# Patient Record
Sex: Female | Born: 1985 | Race: White | Hispanic: No | Marital: Single | Smoking: Never smoker
Health system: Southern US, Community
[De-identification: ages and names within clinical notes are randomized; demographics above are authoritative.]

## PROBLEM LIST (undated history)

## (undated) HISTORY — PX: CHOLECYSTECTOMY: SHX55

---

## 2011-04-20 ENCOUNTER — Ambulatory Visit: Payer: Self-pay | Admitting: Internal Medicine

## 2011-05-12 ENCOUNTER — Ambulatory Visit: Payer: Self-pay | Admitting: Internal Medicine

## 2015-12-19 ENCOUNTER — Emergency Department
Admission: EM | Admit: 2015-12-19 | Discharge: 2015-12-19 | Disposition: A | Discharge status: Home / Self Care | Payer: Managed Care, Other (non HMO) | Attending: Emergency Medicine | Admitting: Emergency Medicine

## 2015-12-19 ENCOUNTER — Ambulatory Visit (INDEPENDENT_AMBULATORY_CARE_PROVIDER_SITE_OTHER)
Admission: EM | Admit: 2015-12-19 | Discharge: 2015-12-19 | Disposition: A | Discharge status: Other Acute Inpatient Hospital | Payer: Managed Care, Other (non HMO) | Source: Home / Self Care | Attending: Family Medicine | Admitting: Family Medicine

## 2015-12-19 ENCOUNTER — Emergency Department: Payer: Managed Care, Other (non HMO)

## 2015-12-19 DIAGNOSIS — R42 Dizziness and giddiness: Secondary | ICD-10-CM

## 2015-12-19 DIAGNOSIS — E860 Dehydration: Secondary | ICD-10-CM

## 2015-12-19 DIAGNOSIS — O2341 Unspecified infection of urinary tract in pregnancy, first trimester: Secondary | ICD-10-CM | POA: Insufficient documentation

## 2015-12-19 DIAGNOSIS — R55 Syncope and collapse: Secondary | ICD-10-CM | POA: Diagnosis not present

## 2015-12-19 DIAGNOSIS — Z3A01 Less than 8 weeks gestation of pregnancy: Secondary | ICD-10-CM | POA: Insufficient documentation

## 2015-12-19 DIAGNOSIS — R110 Nausea: Secondary | ICD-10-CM

## 2015-12-19 DIAGNOSIS — O219 Vomiting of pregnancy, unspecified: Secondary | ICD-10-CM | POA: Diagnosis present

## 2015-12-19 DIAGNOSIS — N390 Urinary tract infection, site not specified: Secondary | ICD-10-CM

## 2015-12-19 DIAGNOSIS — O210 Mild hyperemesis gravidarum: Secondary | ICD-10-CM

## 2015-12-19 DIAGNOSIS — N939 Abnormal uterine and vaginal bleeding, unspecified: Secondary | ICD-10-CM

## 2015-12-19 LAB — CBC
HCT: 36.7 % (ref 35.0–47.0)
Hemoglobin: 12.4 g/dL (ref 12.0–16.0)
MCH: 29.1 pg (ref 26.0–34.0)
MCHC: 33.9 g/dL (ref 32.0–36.0)
MCV: 85.9 fL (ref 80.0–100.0)
PLATELETS: 280 10*3/uL (ref 150–440)
RBC: 4.27 MIL/uL (ref 3.80–5.20)
RDW: 12.8 % (ref 11.5–14.5)
WBC: 13.9 10*3/uL — ABNORMAL HIGH (ref 3.6–11.0)

## 2015-12-19 LAB — BASIC METABOLIC PANEL
Anion gap: 9 (ref 5–15)
BUN: 6 mg/dL (ref 6–20)
CALCIUM: 9.6 mg/dL (ref 8.9–10.3)
CHLORIDE: 102 mmol/L (ref 101–111)
CO2: 25 mmol/L (ref 22–32)
CREATININE: 0.53 mg/dL (ref 0.44–1.00)
GFR calc non Af Amer: >60 mL/min (ref >60)
GLUCOSE: 84 mg/dL (ref 65–99)
Potassium: 4 mmol/L (ref 3.5–5.1)
SODIUM: 136 mmol/L (ref 135–145)

## 2015-12-19 LAB — URINALYSIS COMPLETE WITH MICROSCOPIC (ARMC ONLY)
BILIRUBIN URINE: NEGATIVE
GLUCOSE, UA: NEGATIVE mg/dL
Hgb urine dipstick: NEGATIVE
KETONES UR: NEGATIVE mg/dL
Nitrite: NEGATIVE
PROTEIN: NEGATIVE mg/dL
Specific Gravity, Urine: 1.011 (ref 1.005–1.030)
pH: 8 (ref 5.0–8.0)

## 2015-12-19 LAB — GLUCOSE, CAPILLARY: Glucose-Capillary: 87 mg/dL (ref 65–99)

## 2015-12-19 LAB — HCG, QUANTITATIVE, PREGNANCY: hCG, Beta Chain, Quant, S: 88940 m[IU]/mL — ABNORMAL HIGH (ref <5)

## 2015-12-19 MED ORDER — ACETAMINOPHEN 500 MG PO TABS
1000.0000 mg | ORAL_TABLET | Freq: Once | ORAL | Status: AC
  Administered 2015-12-19: 1000 mg via ORAL

## 2015-12-19 MED ORDER — CEPHALEXIN 500 MG PO CAPS
500.0000 mg | ORAL_CAPSULE | Freq: Once | ORAL | Status: AC
  Administered 2015-12-19: 500 mg via ORAL
  Filled 2015-12-19 (×2): qty 1

## 2015-12-19 MED ORDER — CEPHALEXIN 250 MG PO CAPS: 250.0000 mg | ORAL_CAPSULE | Freq: Four times a day (QID) | ORAL | Status: AC

## 2015-12-19 MED ORDER — SODIUM CHLORIDE 0.9 % IV BOLUS (SEPSIS)
1000.0000 mL | Freq: Once | INTRAVENOUS | Status: AC
  Administered 2015-12-19: 1000 mL via INTRAVENOUS

## 2015-12-19 MED ORDER — ACETAMINOPHEN 500 MG PO TABS
ORAL_TABLET | ORAL | Status: AC
  Filled 2015-12-19: qty 2

## 2015-12-19 MED ORDER — ONDANSETRON HCL 4 MG/2ML IJ SOLN
INTRAMUSCULAR | Status: AC
  Administered 2015-12-19: 4 mg via INTRAVENOUS
  Filled 2015-12-19: qty 2

## 2015-12-19 MED ORDER — ONDANSETRON HCL 4 MG/2ML IJ SOLN
4.0000 mg | Freq: Once | INTRAMUSCULAR | Status: AC
  Administered 2015-12-19: 4 mg via INTRAVENOUS

## 2015-12-19 NOTE — Discharge Instructions (Signed)
Your HCG was 89,0000 today. Ultrasound showed a fetus about the correct size for your expected pregnancy date, however we were not able to see a heartbeat and you will need follow-up to make sure there is not a problem with this pregnancy. It is possible that your baby is growing normally and the heart just was not seen well because it is so small. Please call your doctor for follow-up on Tuesday or Wednesday of this week. We recommend you have a repeat "hCG" testing and an ultrasound to reevaluate the health of your pregnancy.  Urinary Tract Infection Urinary tract infections (UTIs) can develop anywhere along your urinary tract. Your urinary tract is your body's drainage system for removing wastes and extra water. Your urinary tract includes two kidneys, two ureters, a bladder, and a urethra. Your kidneys are a pair of bean-shaped organs. Each kidney is about the size of your fist. They are located below your ribs, one on each side of your spine. CAUSES Infections are caused by microbes, which are microscopic organisms, including fungi, viruses, and bacteria. These organisms are so small that they can only be seen through a microscope. Bacteria are the microbes that most commonly cause UTIs. SYMPTOMS  Symptoms of UTIs may vary by age and gender of the patient and by the location of the infection. Symptoms in young women typically include a frequent and intense urge to urinate and a painful, burning feeling in the bladder or urethra during urination. Older women and men are more likely to be tired, shaky, and weak and have muscle aches and abdominal pain. A fever may mean the infection is in your kidneys. Other symptoms of a kidney infection include pain in your back or sides below the ribs, nausea, and vomiting. DIAGNOSIS To diagnose a UTI, your caregiver will ask you about your symptoms. Your caregiver will also ask you to provide a urine sample. The urine sample will be tested for bacteria and white  blood cells. White blood cells are made by your body to help fight infection. TREATMENT  Typically, UTIs can be treated with medication. Because most UTIs are caused by a bacterial infection, they usually can be treated with the use of antibiotics. The choice of antibiotic and length of treatment depend on your symptoms and the type of bacteria causing your infection. HOME CARE INSTRUCTIONS  If you were prescribed antibiotics, take them exactly as your caregiver instructs you. Finish the medication even if you feel better after you have only taken some of the medication.  Drink enough water and fluids to keep your urine clear or pale yellow.  Avoid caffeine, tea, and carbonated beverages. They tend to irritate your bladder.  Empty your bladder often. Avoid holding urine for long periods of time.  Empty your bladder before and after sexual intercourse.  After a bowel movement, women should cleanse from front to back. Use each tissue only once. SEEK MEDICAL CARE IF:   You have back pain.  You develop a fever.  Your symptoms do not begin to resolve within 3 days. SEEK IMMEDIATE MEDICAL CARE IF:   You have severe back pain or lower abdominal pain.  You develop chills.  You have nausea or vomiting.  You have continued burning or discomfort with urination. MAKE SURE YOU:   Understand these instructions.  Will watch your condition.  Will get help right away if you are not doing well or get worse.   This information is not intended to replace advice given to you  by your health care provider. Make sure you discuss any questions you have with your health care provider.   Document Released: 04/19/2005 Document Revised: 03/31/2015 Document Reviewed: 08/18/2011 Elsevier Interactive Patient Education 2016 [employer]   Morning Sickness Morning sickness is when you feel sick to your stomach (nauseous) during pregnancy. This nauseous feeling may or may not come with vomiting. It  often occurs in the morning but can be a problem any time of day. Morning sickness is most common during the first trimester, but it may continue throughout pregnancy. While morning sickness is unpleasant, it is usually harmless unless you develop severe and continual vomiting (hyperemesis gravidarum). This condition requires more intense treatment.  CAUSES  The cause of morning sickness is not completely known but seems to be related to normal hormonal changes that occur in pregnancy. RISK FACTORS You are at greater risk if you:  Experienced nausea or vomiting before your pregnancy.  Had morning sickness during a previous pregnancy.  Are pregnant with more than one baby, such as twins. TREATMENT  Do not use any medicines (prescription, over-the-counter, or herbal) for morning sickness without first talking to your health care provider. Your health care provider may prescribe or recommend:  Vitamin B6 supplements.  Anti-nausea medicines.  The herbal medicine ginger. HOME CARE INSTRUCTIONS   Only take over-the-counter or prescription medicines as directed by your health care provider.  Taking multivitamins before getting pregnant can prevent or decrease the severity of morning sickness in most women.  Eat a piece of dry toast or unsalted crackers before getting out of bed in the morning.  Eat five or six small meals a day.  Eat dry and bland foods (rice, baked potato). Foods high in carbohydrates are often helpful.  Do not drink liquids with your meals. Drink liquids between meals.  Avoid greasy, fatty, and spicy foods.  Get someone to cook for you if the smell of any food causes nausea and vomiting.  If you feel nauseous after taking prenatal vitamins, take the vitamins at night or with a snack.  Snack on protein foods (nuts, yogurt, cheese) between meals if you are hungry.  Eat unsweetened gelatins for desserts.  Wearing an acupressure wristband (worn for sea sickness)  may be helpful.  Acupuncture may be helpful.  Do not smoke.  Get a humidifier to keep the air in your house free of odors.  Get plenty of fresh air. SEEK MEDICAL CARE IF:   Your home remedies are not working, and you need medicine.  You feel dizzy or lightheaded.  You are losing weight. SEEK IMMEDIATE MEDICAL CARE IF:   You have persistent and uncontrolled nausea and vomiting.  You pass out (faint). MAKE SURE YOU:  Understand these instructions.  Will watch your condition.  Will get help right away if you are not doing well or get worse.   This information is not intended to replace advice given to you by your health care provider. Make sure you discuss any questions you have with your health care provider.   Document Released: 08/31/2006 Document Revised: 07/15/2013 Document Reviewed: 12/25/2012 Elsevier Interactive Patient Education [employer]

## 2015-12-19 NOTE — ED Provider Notes (Signed)
[facility] Emergency Department Provider Note  ____________________________________________  Time seen: Approximately 3:57 PM  I have reviewed the triage vital signs and the nursing notes.   HISTORY  Chief Complaint Near Syncope    HPI Jessica Horne is a 30 y.o. female presents for evaluation of an episode of passing out in the shower. Patient reports that she passed out today in the shower, she got very lightheaded before and laid down and thinks she may have passed out for a second. She did not fall or strike her head or injure herself. She was awake immediately on the floor.  She is about [redacted] weeks pregnant based on a previous last menstrual period. She has not had any abdominal pain and vaginal discharge or bleeding. She had no shortness of breath or chest pain.  She reports that she is taking Zofran because she's been having issues with morning sickness, she's been having occasional vomiting and dehydration, and the last few days she has been experiencing fairly severe symptoms. She has been able to eat and drink some, however reports that she still has ongoing nausea and vomiting, but at the present time this has resolved.  She's been feeling lightheaded especially with standing, and weakness with walking and standing for the last several days.   No past medical history on file.  Psoriatic arthritis  There are no active problems to display for this patient.   Past Surgical History  Procedure Laterality Date  . Cholecystectomy      Current Outpatient Rx  Name  Route  Sig  Dispense  Refill  . cephALEXin (KEFLEX) 250 MG capsule   Oral   Take 1 capsule (250 mg total) by mouth 4 (four) times daily.   40 capsule   0   . ondansetron (ZOFRAN) 4 MG tablet   Oral   Take 4 mg by mouth every 8 (eight) hours as needed for nausea or vomiting.           Allergies Sulfa antibiotics  No family history on file.  Social History Social History   Substance Use Topics  . Smoking status: Never Smoker   . Smokeless tobacco: Not on file  . Alcohol Use: No    Review of Systems Constitutional: No fever/chills Eyes: No visual changes. ENT: No sore throat. Cardiovascular: Denies chest pain. Respiratory: Denies shortness of breath. Gastrointestinal: No abdominal pain.  No nausea, no vomiting.  No diarrhea.  No constipation. Genitourinary: Negative for dysuria.Increased urinary frequency. Musculoskeletal: Negative for back pain. Skin: Negative for rash. Neurological: Negative for headaches, focal weakness or numbness.  10-point ROS otherwise negative.  ____________________________________________   PHYSICAL EXAM:  VITAL SIGNS: ED Triage Vitals  Enc Vitals Group     BP 12/19/15 1352 105/57 mmHg     Pulse Rate 12/19/15 1352 57     Resp 12/19/15 1352 20     Temp 12/19/15 1352 98.4 F (36.9 C)     Temp Source 12/19/15 1352 Oral     SpO2 12/19/15 1352 100 %     Weight 12/19/15 1352 165 lb (74.844 kg)     Height 12/19/15 1352 5' (1.524 m)     Head Cir --      Peak Flow --      Pain Score --      Pain Loc --      Pain Edu? --      Excl. in GC? --    Constitutional: Alert and oriented. Well appearing and  in no acute distress.Very pleasant and escorted by her husband. Eyes: Conjunctivae are normal. PERRL. EOMI. Head: Atraumatic. Nose: No congestion/rhinnorhea. Mouth/Throat: Mucous membranes are moist.  Oropharynx non-erythematous. Neck: No stridor.   Cardiovascular: Normal rate, regular rhythm. Grossly normal heart sounds.  Good peripheral circulation. Respiratory: Normal respiratory effort.  No retractions. Lungs CTAB. Gastrointestinal: Soft and nontender. No distention. No uterus palpable. No abdominal bruits. No CVA tenderness. Musculoskeletal: No lower extremity tenderness nor edema.  No joint effusions. Neurologic:  Normal speech and language. No gross focal neurologic deficits are appreciated. No gait  instability. Skin:  Skin is warm, dry and intact. No rash noted. Psychiatric: Mood and affect are normal. Speech and behavior are normal.  ____________________________________________   LABS (all labs ordered are listed, but only abnormal results are displayed)  Labs Reviewed  CBC - Abnormal; Notable for the following:    WBC 13.9 (*)    All other components within normal limits  URINALYSIS COMPLETEWITH MICROSCOPIC ([facility] ONLY) - Abnormal; Notable for the following:    Color, Urine YELLOW (*)    APPearance HAZY (*)    Leukocytes, UA 3+ (*)    Bacteria, UA RARE (*)    Squamous Epithelial / LPF 0-5 (*)    All other components within normal limits  HCG, QUANTITATIVE, PREGNANCY - Abnormal; Notable for the following:    hCG, Beta Chain, Quant, S 16109 (*)    All other components within normal limits  URINE CULTURE  BASIC METABOLIC PANEL  CBG MONITORING, ED   ____________________________________________  EKG  Reviewed and are me at 1610 Ventricular rate 60 QRS 80 QTc 420 Reviewed and interpreted as normal sinus rhythm, there is a slight hint of an incomplete right bundle branch block with associated T-wave inversions likely secondary to a mild incomplete block. There is no evidence of acute ischemia. It is notable patient is not complaining of any cardiac or pulmonary symptoms. ____________________________________________  RADIOLOGY  US Ob Transvaginal (Final result) Result time: 12/19/15 18:15:17   Final result by Rad Results In Interface (12/19/15 18:15:17)   Narrative:   CLINICAL DATA: Pregnancy. Vaginal spotting for 1 day.  EXAM: OBSTETRIC <14 WK [place] AND TRANSVAGINAL OB US  TECHNIQUE: Both transabdominal and transvaginal ultrasound examinations were performed for complete evaluation of the gestation as well as the maternal uterus, adnexal regions, and pelvic cul-de-sac. Transvaginal technique was performed to assess early pregnancy.  COMPARISON:  None.  FINDINGS: Intrauterine gestational sac: Single  Yolk sac: Present  Embryo: Present  Cardiac Activity: Not present  CRL: 6.7 mm  6 w  4 d         [place] EDC: 08/09/2016  Subchorionic hemorrhage: Subchorionic hemorrhage measuring 2.6 x 1.3 x 1.1 cm.  Maternal uterus/adnexae: Normal left ovary. 2.2 x 2.1 x 1.2 cm hypoechoic right ovarian mass with peripheral Doppler flow likely representing a corpus luteum cyst. Small amount of pelvic free fluid.  IMPRESSION: 1. Intrauterine pregnancy. No cardiac activity identified at this time. Findings are suspicious but not yet definitive for failed pregnancy. Recommend follow-up US in 10-14 days for definitive diagnosis. This recommendation follows SRU consensus guidelines: Diagnostic Criteria for Nonviable Pregnancy Early in the First Trimester. Malva Limes Med 2013; 604:5409-81.   Electronically Signed By: Elige Ko On: 12/19/2015 18:15          US OB Comp Less 14 Wks (Final result) Result time: 12/19/15 18:15:17   Procedure changed from US OB Limited      Final result by Rad Results  In Interface (12/19/15 18:15:17)   Narrative:   CLINICAL DATA: Pregnancy. Vaginal spotting for 1 day.  EXAM: OBSTETRIC <14 WK US AND TRANSVAGINAL OB US  TECHNIQUE: Both transabdominal and transvaginal ultrasound examinations were performed for complete evaluation of the gestation as well as the maternal uterus, adnexal regions, and pelvic cul-de-sac. Transvaginal technique was performed to assess early pregnancy.  COMPARISON: None.  FINDINGS: Intrauterine gestational sac: Single  Yolk sac: Present  Embryo: Present  Cardiac Activity: Not present  CRL: 6.7 mm  6 w  4 d         [place] EDC: 08/09/2016  Subchorionic hemorrhage: Subchorionic hemorrhage measuring 2.6 x 1.3 x 1.1 cm.  Maternal uterus/adnexae: Normal left ovary. 2.2 x 2.1 x 1.2 cm hypoechoic right ovarian mass with peripheral  Doppler flow likely representing a corpus luteum cyst. Small amount of pelvic free fluid.  IMPRESSION: 1. Intrauterine pregnancy. No cardiac activity identified at this time. Findings are suspicious but not yet definitive for failed pregnancy. Recommend follow-up US in 10-14 days for definitive diagnosis. This recommendation follows SRU consensus guidelines: Diagnostic Criteria for Nonviable Pregnancy Early in the First Trimester. Malva Limes Engl J Med 2013; 161:0960-45; 369:1443-51.   Electronically Signed By: Elige KoHetal Patel On: 12/19/2015 18:15    ____________________________________________   PROCEDURES  Procedure(s) performed: None  Critical Care performed: No  ____________________________________________   INITIAL IMPRESSION / ASSESSMENT AND PLAN / ED COURSE  Pertinent labs & imaging results that were available during my care of the patient were reviewed by me and considered in my medical decision making (see chart for details).  Patient presents with a syncopal episode. She presently reports feeling well, but feels she may be dehydrated due to morning sickness. There symptoms of passing out in the shower without any associated cardiac or pulmonary symptoms or acute neurologic symptoms sound like they're likely due to dehydration and possible vasovagal type syncope. She does report pregnancy but has not had intrauterine pregnancy confirmation, and based on laboratory testing suspect likely a mild urinary tract infection.  Overall very reassuring exam with stable hemodynamics and slight low blood pressure in the ER which may be secondary to pregnancy or slightly due to dehydration.  Normal and reassuring abdominal cardiac neurologic and respiratory exam. We will hydrate her well, obtain ultrasound to evaluate for intrauterine pregnancy, and treat for possible urinary tract infection.  ----------------------------------------- 6:59 PM on  12/19/2015 -----------------------------------------  Ultrasound discussed carefully with the patient, she will follow up for further evaluation and testing with her OB/GYN doctor. She is well hydrated, awake alert in no distress. Discussed careful return precautions, hydration, and follow up with OB/GYN. ____________________________________________   FINAL CLINICAL IMPRESSION(S) / ED DIAGNOSES  Final diagnoses:  Morning sickness  Nausea  Dehydration  Syncope, vasovagal  Acute UTI (urinary tract infection)      Sharyn CreamerMark Quale, MD 12/19/15 1859

## 2015-12-19 NOTE — Discharge Instructions (Signed)
Go directly to emergency room as discussed.   Dizziness Dizziness is a common problem. It is a feeling of unsteadiness or light-headedness. You may feel like you are about to faint. Dizziness can lead to injury if you stumble or fall. Anyone can become dizzy, but dizziness is more common in older adults. This condition can be caused by a number of things, including medicines, dehydration, or illness. HOME CARE INSTRUCTIONS Taking these steps may help with your condition: Eating and Drinking  Drink enough fluid to keep your urine clear or pale yellow. This helps to keep you from becoming dehydrated. Try to drink more clear fluids, such as water.  Do not drink alcohol.  Limit your caffeine intake if directed by your health care provider.  Limit your salt intake if directed by your health care provider. Activity  Avoid making quick movements.  Rise slowly from chairs and steady yourself until you feel okay.  In the morning, first sit up on the side of the bed. When you feel okay, stand slowly while you hold onto something until you know that your balance is fine.  Move your legs often if you need to stand in one place for a long time. Tighten and relax your muscles in your legs while you are standing.  Do not drive or operate heavy machinery if you feel dizzy.  Avoid bending down if you feel dizzy. Place items in your home so that they are easy for you to reach without leaning over. Lifestyle  Do not use any tobacco products, including cigarettes, chewing tobacco, or electronic cigarettes. If you need help quitting, ask your health care provider.  Try to reduce your stress level, such as with yoga or meditation. Talk with your health care provider if you need help. General Instructions  Watch your dizziness for any changes.  Take medicines only as directed by your health care provider. Talk with your health care provider if you think that your dizziness is caused by a medicine that  you are taking.  Tell a friend or a family member that you are feeling dizzy. If he or she notices any changes in your behavior, have this person call your health care provider.  Keep all follow-up visits as directed by your health care provider. This is important. SEEK MEDICAL CARE IF:  Your dizziness does not go away.  Your dizziness or light-headedness gets worse.  You feel nauseous.  You have reduced hearing.  You have new symptoms.  You are unsteady on your feet or you feel like the room is spinning. SEEK IMMEDIATE MEDICAL CARE IF:  You vomit or have diarrhea and are unable to eat or drink anything.  You have problems talking, walking, swallowing, or using your arms, hands, or legs.  You feel generally weak.  You are not thinking clearly or you have trouble forming sentences. It may take a friend or family member to notice this.  You have chest pain, abdominal pain, shortness of breath, or sweating.  Your vision changes.  You notice any bleeding.  You have a headache.  You have neck pain or a stiff neck.  You have a fever.   This information is not intended to replace advice given to you by your health care provider. Make sure you discuss any questions you have with your health care provider.   Document Released: 01/03/2001 Document Revised: 11/24/2014 Document Reviewed: 07/06/2014 Elsevier Interactive Patient Education 2016 [employer]  Near-Syncope Near-syncope (commonly known as near fainting) is sudden  weakness, dizziness, or feeling like you might pass out. During an episode of near-syncope, you may also develop pale skin, have tunnel vision, or feel sick to your stomach (nauseous). Near-syncope may occur when getting up after sitting or while standing for a long time. It is caused by a sudden decrease in blood flow to the brain. This decrease can result from various causes or triggers, most of which are not serious. However, because near-syncope can  sometimes be a sign of something serious, a medical evaluation is required. The specific cause is often not determined. HOME CARE INSTRUCTIONS  Monitor your condition for any changes. The following actions may help to alleviate any discomfort you are experiencing:  Have someone stay with you until you feel stable.  Lie down right away and prop your feet up if you start feeling like you might faint. Breathe deeply and steadily. Wait until all the symptoms have passed. Most of these episodes last only a few minutes. You may feel tired for several hours.   Drink enough fluids to keep your urine clear or pale yellow.   If you are taking blood pressure or heart medicine, get up slowly when seated or lying down. Take several minutes to sit and then stand. This can reduce dizziness.  Follow up with your health care provider as directed. SEEK IMMEDIATE MEDICAL CARE IF:   You have a severe headache.   You have unusual pain in the chest, abdomen, or back.   You are bleeding from the mouth or rectum, or you have black or tarry stool.   You have an irregular or very fast heartbeat.   You have repeated fainting or have seizure-like jerking during an episode.   You faint when sitting or lying down.   You have confusion.   You have difficulty walking.   You have severe weakness.   You have vision problems.  MAKE SURE YOU:   Understand these instructions.  Will watch your condition.  Will get help right away if you are not doing well or get worse.   This information is not intended to replace advice given to you by your health care provider. Make sure you discuss any questions you have with your health care provider.   Document Released: 07/10/2005 Document Revised: 07/15/2013 Document Reviewed: 12/13/2012 Elsevier Interactive Patient Education [employer]

## 2015-12-19 NOTE — ED Notes (Addendum)
Pt went to urgent care today for dizziness and vomiting. Pt states she is [redacted] weeks pregnant. Pt reports vomiting 2-3 times a day mostly at night no vomiting today. Pt also reports diarrhea. 2 episodes of diarrhea this morning denies fever. Denies pain  Pt states this morning she had an episode where she felt weak and almost fell in the shower

## 2015-12-19 NOTE — ED Notes (Signed)
Patient complains of dizziness, vomiting, off balance. Patient states that she blacked out in the shower this morning. Patient denies bleeding or abdominal pain. Patient states that she is [redacted] weeks pregnant and called OB last week and was given Rx for Zofran. She states that this feels different than how she has been recently with morning sickness.

## 2015-12-19 NOTE — ED Provider Notes (Signed)
[facility]  ____________________________________________  Time seen: Approximately 1:16 PM  I have reviewed the triage vital signs and the nursing notes.   HISTORY  Chief Complaint Dizziness  HPI Jessica Horne is a 30 y.o. female presents with husband at bedside for the complaints of dizziness. Patient reports that she is approximately [redacted] weeks pregnant based on her last period and tracking at home. Reports this pregnancy has not been confirmed by OB/GYN by ultrasound. Patient reports that she has had nausea and vomiting intermittently so far this pregnancy but states this has been worse. Patient reports that since Tuesday of this week she has had increased nausea and vomiting with intermittent dizzy episodes. Patient states the dizzy episodes have gradually increased over the week. Patient reports that today at approximately 11 AM she was in the shower, felt overheated and her body felt "heavy ". Patient states that she then "passed out". Patient states that she thinks that she did lose consciousness, but not sure, and states that it would've been for only 1 second or 2 seconds. States this was unwitnessed. Patient states that she did fall but states that she landed in a sitting position. Patient denies head injury. Patient denies any previous history of syncope. Patient reports that since the episode today she has continued dizziness and lightheadedness. Patient states that she does feel intermittently off balance. Patient reports that the nausea and vomiting have also continued. Patient reports the nausea and vomiting are uncontrolled by Zofran.   Patient reports gravida 2 para 1. Last menstrual 10/30/2015. Patient reports last pregnancy was uncomplicated until approximately 38 weeks when she had "an infection in the baby's fluid "and was induced but denies any other palpitations. Patient reports that overall she is in good health. Denies any recent sickness or recent infections. Denies  chest pain, shortness of breath, abdominal pain, vaginal bleeding, vaginal discharge, neck pain, back pain, extremity pain or injury. Patient reports that she did have a slight vaginal spotting one episode last week but denies any other similar. Patient does also reports that 2 weeks ago she abruptly stopped taking her Celexa in which she had been taking for several years.  PCP: [facility]: [facility]    History reviewed. No pertinent past medical history.  There are no active problems to display for this patient.   Past Surgical History  Procedure Laterality Date  . Cholecystectomy      Current Outpatient Rx  Name  Route  Sig  Dispense  Refill  . ondansetron (ZOFRAN) 4 MG tablet   Oral   Take 4 mg by mouth every 8 (eight) hours as needed for nausea or vomiting.           Allergies Sulfa antibiotics  No family history on file.  Social History Social History  Substance Use Topics  . Smoking status: Never Smoker   . Smokeless tobacco: None  . Alcohol Use: No    Review of Systems Constitutional: No fever/chills Eyes: No visual changes. ENT: No sore throat. Cardiovascular: Denies chest pain. Respiratory: Denies shortness of breath. Gastrointestinal: No abdominal pain.  No nausea, no vomiting.  No diarrhea.  No constipation. Genitourinary: Negative for dysuria. Musculoskeletal: Negative for back pain. Skin: Negative for rash. Neurological: Negative for headaches, focal weakness or numbness.  10-point ROS otherwise negative.  ____________________________________________   PHYSICAL EXAM:  VITAL SIGNS: ED Triage Vitals  Enc Vitals Group     BP 12/19/15 1226 123/68 mmHg     Pulse Rate 12/19/15  1226 66     Resp 12/19/15 1226 17     Temp 12/19/15 1226 98.3 F (36.8 C)     Temp Source 12/19/15 1226 Oral     SpO2 12/19/15 1226 100 %     Weight 12/19/15 1226 165 lb (74.844 kg)     Height 12/19/15 1226 5' (1.524 m)     Head Cir --      Peak Flow --       Pain Score 12/19/15 1228 0     Pain Loc --      Pain Edu? --      Excl. in GC? --    Orthostatic VS for the past 24 hrs:  BP- Lying Pulse- Lying BP- Sitting Pulse- Sitting BP- Standing at 0 minutes Pulse- Standing at 0 minutes  12/19/15 1229 106/57 mmHg 62 107/73 mmHg 62 111/67 mmHg 63    Constitutional: Alert and oriented. Well appearing and in no acute distress. Eyes: Conjunctivae are normal. PERRL. EOMI. Head: Atraumatic.Nontender.  Ears: no erythema, normal TMs bilaterally.   Nose: No congestion/rhinnorhea.  Mouth/Throat: Mucous membranes are moist.  Oropharynx non-erythematous. No dental injury noted. Neck: No stridor.  No cervical spine tenderness to palpation. Hematological/Lymphatic/Immunilogical: No cervical lymphadenopathy. Cardiovascular: Normal rate, regular rhythm. Grossly normal heart sounds.  Good peripheral circulation. Respiratory: Normal respiratory effort.  No retractions. Lungs CTAB. No wheezes, rales or rhonchi. Gastrointestinal: Soft and nontender. No CVA tenderness. Musculoskeletal: No lower or upper extremity tenderness nor edema. No cervical, thoracic or lumbar tenderness to palpation. Bilateral pedal pulses equal and easily palpated.  Neurologic:  Normal speech and language. No gross focal neurologic deficits are appreciated. No gait instability.5/5 strength to bilateral upper and lower extremities. Negative Romberg. Sensation intact to bilateral upper and lower extremities.  Skin:  Skin is warm, dry and intact. No rash noted. Psychiatric: Mood and affect are normal. Speech and behavior are normal.  ____________________________________________   LABS (all labs ordered are listed, but only abnormal results are displayed)  Labs Reviewed  GLUCOSE, CAPILLARY  CBG MONITORING, ED    RADIOLOGY  No results found. ____________________________________________   INITIAL IMPRESSION / ASSESSMENT AND PLAN / ED COURSE  Pertinent labs & imaging results that were  available during my care of the patient were reviewed by me and considered in my medical decision making (see chart for details).   Overall well-appearing patient. Presents with husband at bedside for the complaints of dizziness, nausea and vomiting with episode of possible syncope today. Patient is approximate [redacted] weeks pregnant based on last menstrual and per patient's calculations. Patient reports that she was tracking her menstrual was very closely as they were trying to conceive. As patient has continued dizziness, nausea and vomiting, possible syncope and is unconfirmed [redacted] week pregnant, discussed in detail with patient and spouse recommended for further evaluation emergency room of their choice. Discussed in detail with patient will likely need laboratory studies as well as ultrasound. The ultrasound availability at this facility at this time. Patient alert and oriented with decisional capacity and states that her husband will drive her. Patient and husband reports they will go directly to [facility] emergency room. [facility] charge nurse called and report given. Recommend patient get a directly to the emergency room. Patient stable at the time of discharge and transfer.   Discussed follow up with Primary care physician this week. Discussed follow up and return parameters including no resolution or any worsening concerns. Patient verbalized understanding and agreed to plan.  ____________________________________________   FINAL CLINICAL IMPRESSION(S) / ED DIAGNOSES  Final diagnoses:  Near syncope  Dizziness     New Prescriptions   No medications on file    Note: This dictation was prepared with Dragon dictation along with smaller phrase technology. Any transcriptional errors that result from this process are unintentional.      Renford DillsLindsey Zohar Laing, NP 12/19/15 1425

## 2015-12-21 LAB — URINE CULTURE: Special Requests: NORMAL

## 2016-06-06 IMAGING — US US OB COMP LESS 14 WK
1 series · 13 of 28 positions shown · non-contrast
Comparison: None.

CLINICAL DATA: Pregnancy.  Vaginal spotting for 1 day.

EXAM:
OBSTETRIC <14 WK US AND TRANSVAGINAL OB US
TECHNIQUE: Both transabdominal and transvaginal ultrasound examinations were
performed for complete evaluation of the gestation as well as the
maternal uterus, adnexal regions, and pelvic cul-de-sac.
Transvaginal technique was performed to assess early pregnancy.

[Series 1: us ob comp less 14 wk · 0.17mm/px · 13 of 100 slices shown]
[im 4/100]
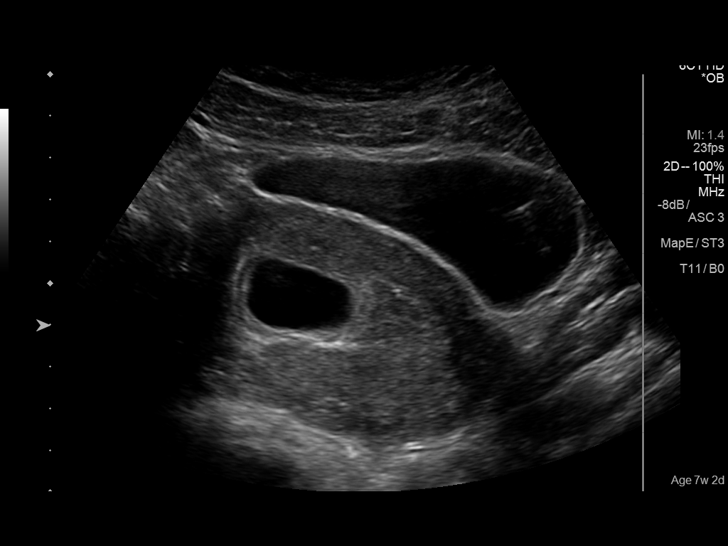
[im 12/100]
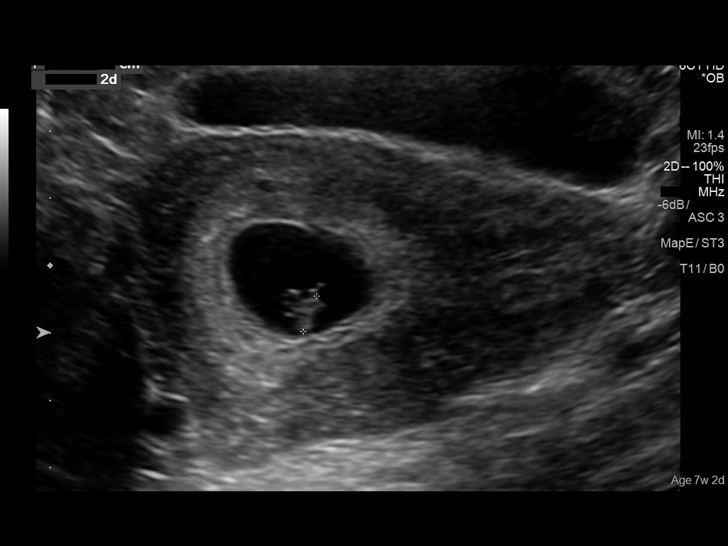
[im 19/100]
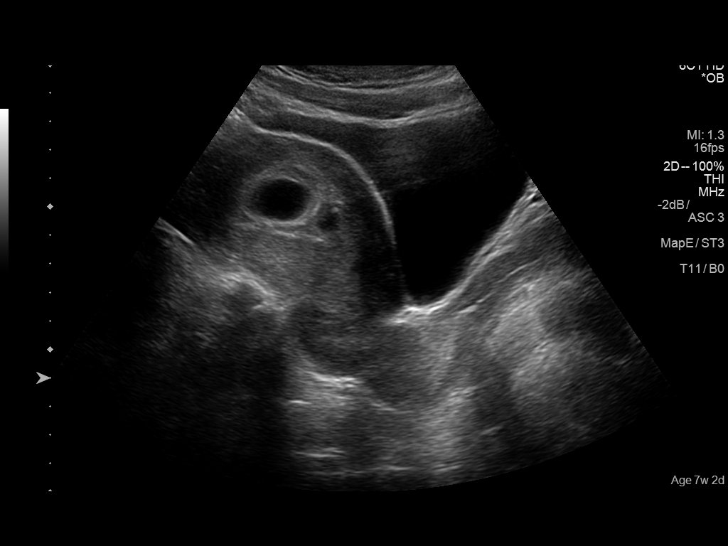
[im 26/100]
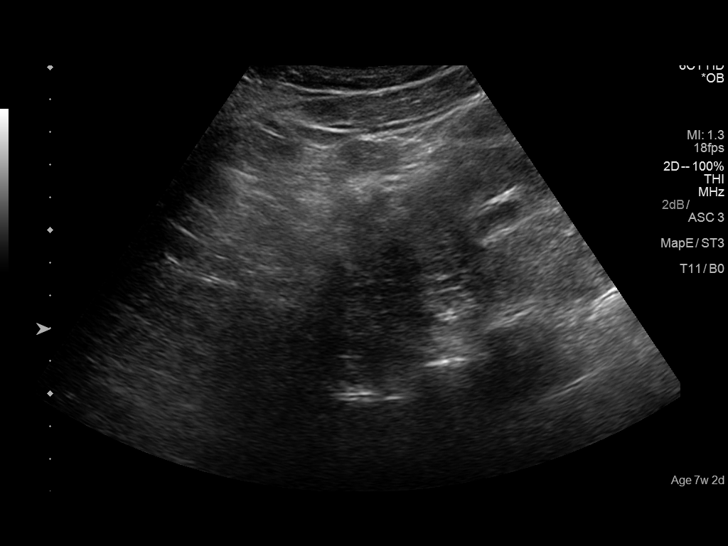
[im 34/100]
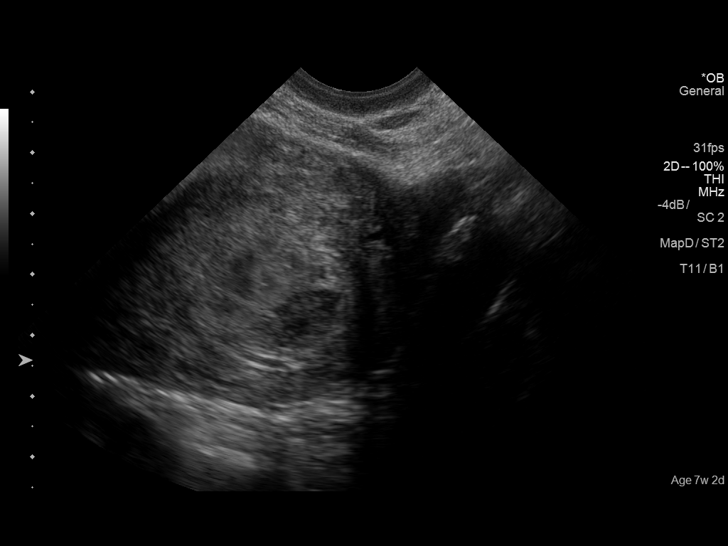
[im 41/100]
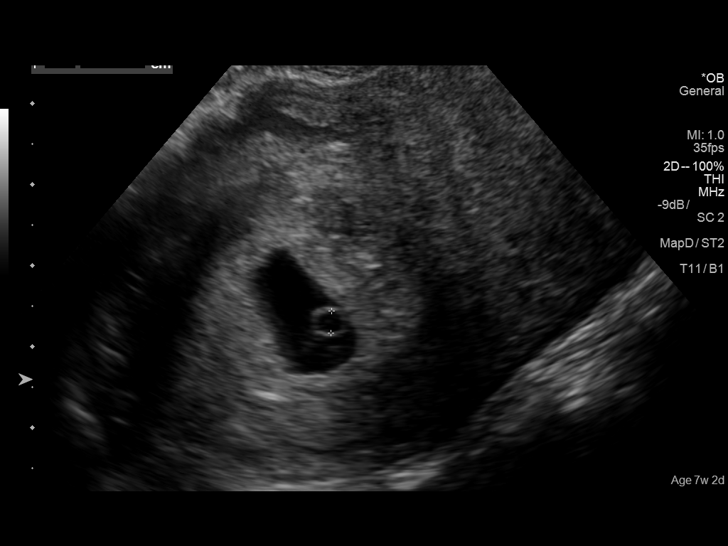
[im 52/100]
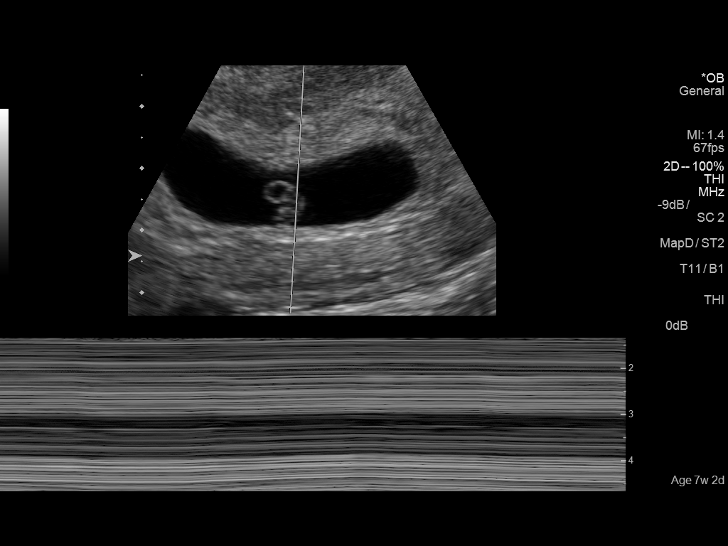
[im 59/100]
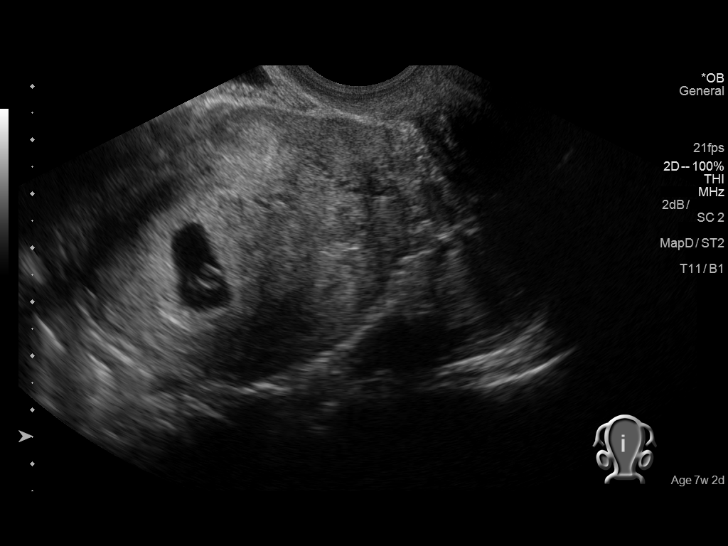
[im 67/100]
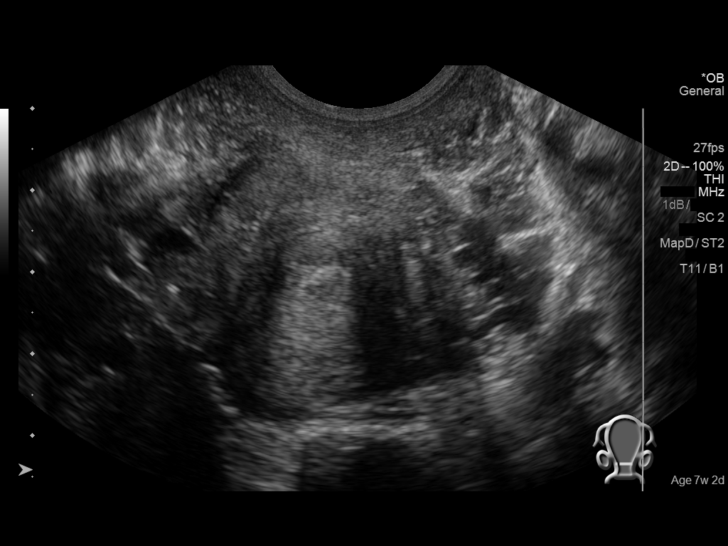
[im 74/100]
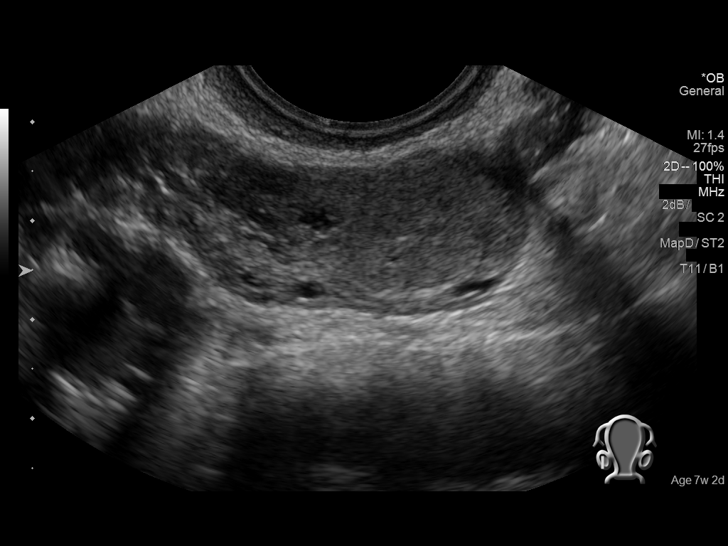
[im 81/100]
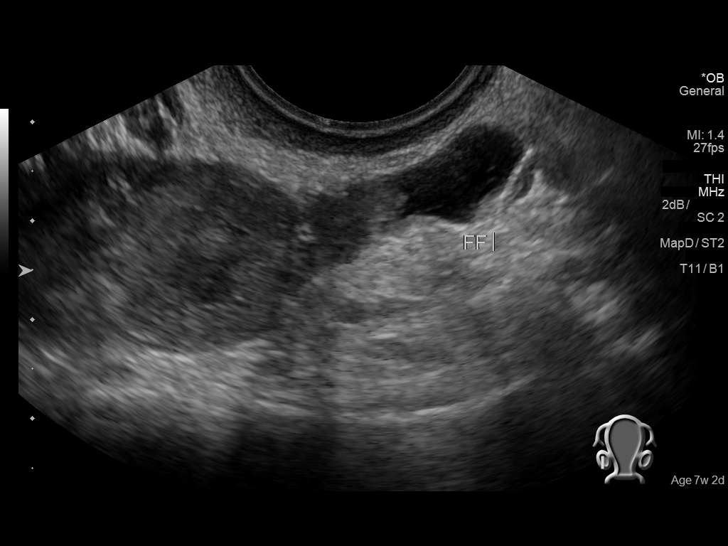
[im 89/100]
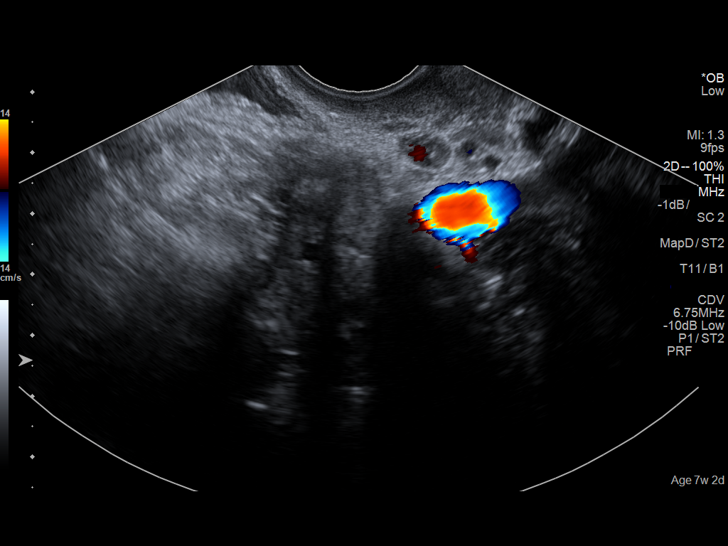
[im 96/100]
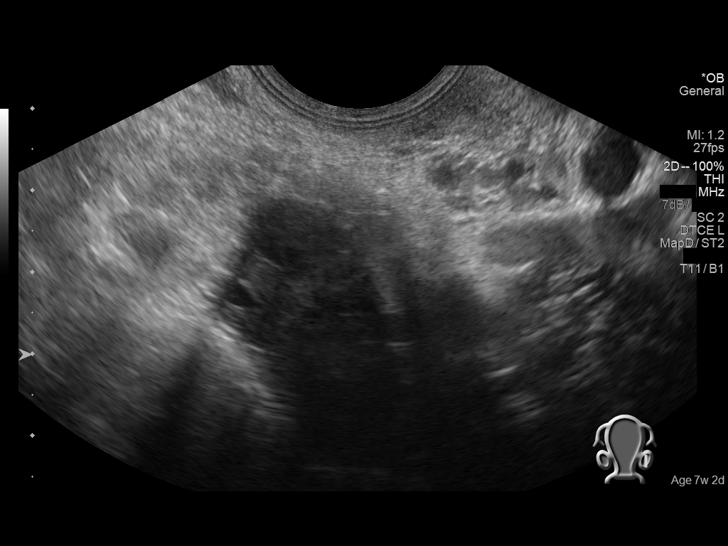

[13 of 28 positions shown; findings below may reference images not displayed]

FINDINGS: Intrauterine gestational sac: Single

Yolk sac:  Present

Embryo:  Present

Cardiac Activity: Not present

CRL:  6.7  mm   6 w   4 d                  US EDC: 08/09/2016

Subchorionic hemorrhage: Subchorionic hemorrhage measuring 2.6 x
x 1.1 cm.

Maternal uterus/adnexae: Normal left ovary. 2.2 x 2.1 x 1.2 cm
hypoechoic right ovarian mass with peripheral Doppler flow likely
representing a corpus luteum cyst. Small amount of pelvic free
fluid.
IMPRESSION: 1. Intrauterine pregnancy. No cardiac activity identified at this
time. Findings are suspicious but not yet definitive for failed
pregnancy. Recommend follow-up US in 10-14 days for definitive
diagnosis. This recommendation follows SRU consensus guidelines:
Diagnostic Criteria for Nonviable Pregnancy Early in the First
Trimester. N Engl J Med 8810; [DATE].

## 2016-07-26 ENCOUNTER — Emergency Department
Admission: EM | Admit: 2016-07-26 | Discharge: 2016-07-26 | Disposition: A | Discharge status: Home / Self Care | Payer: Managed Care, Other (non HMO) | Attending: Student in an Organized Health Care Education/Training Program | Admitting: Student in an Organized Health Care Education/Training Program

## 2016-07-26 ENCOUNTER — Emergency Department: Payer: Managed Care, Other (non HMO)

## 2016-07-26 ENCOUNTER — Encounter: Payer: Self-pay | Admitting: Emergency Medicine

## 2016-07-26 DIAGNOSIS — N9489 Other specified conditions associated with female genital organs and menstrual cycle: Secondary | ICD-10-CM | POA: Insufficient documentation

## 2016-07-26 DIAGNOSIS — O209 Hemorrhage in early pregnancy, unspecified: Secondary | ICD-10-CM | POA: Diagnosis present

## 2016-07-26 DIAGNOSIS — Z3A08 8 weeks gestation of pregnancy: Secondary | ICD-10-CM | POA: Insufficient documentation

## 2016-07-26 DIAGNOSIS — O039 Complete or unspecified spontaneous abortion without complication: Secondary | ICD-10-CM | POA: Diagnosis not present

## 2016-07-26 DIAGNOSIS — N939 Abnormal uterine and vaginal bleeding, unspecified: Secondary | ICD-10-CM

## 2016-07-26 LAB — HCG, QUANTITATIVE, PREGNANCY: hCG, Beta Chain, Quant, S: 17725 m[IU]/mL — ABNORMAL HIGH (ref <5)

## 2016-07-26 LAB — ABO/RH: ABO/RH(D): O POS

## 2016-07-26 MED ORDER — PROMETHAZINE HCL 12.5 MG PO TABS: 12.5000 mg | ORAL_TABLET | Freq: Four times a day (QID) | ORAL | 0 refills | Status: AC | PRN

## 2016-07-26 NOTE — ED Notes (Signed)

## 2016-07-26 NOTE — ED Provider Notes (Signed)
[facility] Emergency Department Provider Note    First MD Initiated Contact with Patient 07/26/16 2039     (approximate)  I have reviewed the triage vital signs and the nursing notes.   HISTORY  Chief Complaint Vaginal Bleeding    HPI Jessica Horne is a 31 y.o. female  G3P1A1 who is roughly [redacted] weeks pregnant that presents with 1 week of vaginal bleeding. States that she initially noted spotting on Monday only when she wiped. I did not notice any sort of tissue or clots. This is been ongoing for roughly 1 week. No report of heavy bleeding. No abdominal pain. Rest she had a confirmatory ultrasound 2 weeks ago that was normal. 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 Woke up this morning and had heavier bright bleeding times one today.   States she has no persistent bleeding at this time. Denies any pain. Came to the ER due to concern for miscarriage is she's had one of those previously.    History reviewed. No pertinent past medical history. History reviewed. No pertinent family history. Past Surgical History:  Procedure Laterality Date  . CHOLECYSTECTOMY     There are no active problems to display for this patient.     Prior to Admission medications   Medication Sig Start Date End Date Taking? Authorizing Provider  cephALEXin (KEFLEX) 250 MG capsule Take 1 capsule (250 mg total) by mouth 4 (four) times daily. 12/19/15   Sharyn CreamerMark Quale, MD  ondansetron (ZOFRAN) 4 MG tablet Take 4 mg by mouth every 8 (eight) hours as needed for nausea or vomiting.    Historical Provider, MD  promethazine (PHENERGAN) 12.5 MG tablet Take 1 tablet (12.5 mg total) by mouth every 6 (six) hours as needed for nausea or vomiting. 07/26/16   Willy EddyPatrick Anival Pasha, MD    Allergies Sulfa antibiotics    Social History Social History  Substance Use Topics  . Smoking status: Never Smoker  . Smokeless tobacco: Never Used  . Alcohol use No      Review of Systems Patient denies headaches, rhinorrhea, blurry vision, numbness, shortness of breath, chest pain, edema, cough, abdominal pain, nausea, vomiting, diarrhea, dysuria, fevers, rashes or hallucinations unless otherwise stated above in HPI. ____________________________________________   PHYSICAL EXAM:  VITAL SIGNS: Vitals:   07/26/16 1906 07/26/16 2227  BP: 123/67 127/84  Pulse: 70 89  Resp: 16 18  Temp: 98.7 F (37.1 C)     Constitutional: Alert and oriented. Well appearing and in no acute distress. Eyes: Conjunctivae are normal. PERRL. EOMI. Head: Atraumatic. Nose: No congestion/rhinnorhea. Mouth/Throat: Mucous membranes are moist.  Oropharynx non-erythematous. Neck: No stridor. Painless ROM. No cervical spine tenderness to palpation Hematological/Lymphatic/Immunilogical: No cervical lymphadenopathy. Cardiovascular: Normal rate, regular rhythm. Grossly normal heart sounds.  Good peripheral circulation. Respiratory: Normal respiratory effort.  No retractions. Lungs CTAB. Gastrointestinal: gravid, Soft and nontender. No distention. No abdominal bruits. No CVA tenderness. Genitourinary: Normal appearing pelvic exam. Cervix is closed. No evidence of hemorrhage. No evidence of retained fetal products. Musculoskeletal: No lower extremity tenderness nor edema.  No joint effusions. Neurologic:  Normal speech and language. No gross focal neurologic deficits are appreciated. No gait instability. Skin:  Skin is warm, dry and intact. No rash noted. Psychiatric: Mood and affect are normal. Speech and behavior are normal.  ____________________________________________   LABS (all labs ordered are listed, but only abnormal results are displayed)  Results for orders placed or  performed during the hospital encounter of 07/26/16 (from the past 24 hour(s))  hCG, quantitative, pregnancy     Status: Abnormal   Collection Time: 07/26/16  7:06 PM  Result Value Ref Range   hCG,  Beta Chain, Quant, S 17,725 (H) <5 mIU/mL  ABO/Rh     Status: None   Collection Time: 07/26/16  7:06 PM  Result Value Ref Range   ABO/RH(D) O POS    ____________________________________________  ____________________________________________  RADIOLOGY  I personally reviewed all radiographic images ordered to evaluate for the above acute complaints and reviewed radiology reports and findings.  These findings were personally discussed with the patient.  Please see medical record for radiology report. ____________________________________________   PROCEDURES  Procedure(s) performed:  Procedures    Critical Care performed: no ____________________________________________   INITIAL IMPRESSION / ASSESSMENT AND PLAN / ED COURSE  Pertinent labs & imaging results that were available during my care of the patient were reviewed by me and considered in my medical decision making (see chart for details).  DDX: miscarriage, first trimester bleeding, incomplete ab, ectopic  Jessica Horne is a 31 y.o. who presents to the ED with first trimester bleeding. Afebrile and hemodynamically stable. Abdominal exam is soft and benign. Unable to clearly identify intrauterine pregnancy on transabdominal ultrasound therefore  will order a formal ultrasound.  The patient will be placed on continuous pulse oximetry and telemetry for monitoring.  Laboratory evaluation will be sent to evaluate for the above complaints.     Clinical Course as of Jul 26 2237  Wed Jul 26, 2016  2226 Ultrasound shows evidence of probable nonviable pregnancy. Discussed spontaneous abortion and management with patient. She previously elected to have a D&C performed after previous miscarriage. She remains hemodynamically stable. Reassurance support provided to family. Patient agreeable to follow-up with OB/GYN in the morning.  Have discussed with the patient and available family all diagnostics and treatments performed thus far and  all questions were answered to the best of my ability. The patient demonstrates understanding and agreement with plan.   [PR]    Clinical Course User Index [PR] Willy Eddy, MD     ____________________________________________   FINAL CLINICAL IMPRESSION(S) / ED DIAGNOSES  Final diagnoses:  Vaginal bleeding  Spontaneous miscarriage      NEW MEDICATIONS STARTED DURING THIS VISIT:  Discharge Medication List as of 07/26/2016 10:25 PM    START taking these medications   Details  promethazine (PHENERGAN) 12.5 MG tablet Take 1 tablet (12.5 mg total) by mouth every 6 (six) hours as needed for nausea or vomiting., Starting Wed 07/26/2016, Print         Note:  This document was prepared using Dragon voice recognition software and may include unintentional dictation errors.    Willy Eddy, MD 07/26/16 2239

## 2016-07-26 NOTE — ED Triage Notes (Signed)
Pt ambulatory to triage in NAD, reports [redacted] weeks pregnant and vag bleeding since Monday, reports G3P1A1, no complications this pregnancy.  Pt reports bleeding only when wiping.

## 2018-02-02 DIAGNOSIS — Z79899 Other long term (current) drug therapy: Secondary | ICD-10-CM | POA: Diagnosis not present

## 2018-02-02 DIAGNOSIS — Z3A01 Less than 8 weeks gestation of pregnancy: Secondary | ICD-10-CM | POA: Insufficient documentation

## 2018-02-02 DIAGNOSIS — O200 Threatened abortion: Secondary | ICD-10-CM | POA: Insufficient documentation

## 2018-02-02 DIAGNOSIS — B9689 Other specified bacterial agents as the cause of diseases classified elsewhere: Secondary | ICD-10-CM | POA: Insufficient documentation

## 2018-02-02 DIAGNOSIS — O23591 Infection of other part of genital tract in pregnancy, first trimester: Secondary | ICD-10-CM | POA: Insufficient documentation

## 2018-02-02 DIAGNOSIS — O209 Hemorrhage in early pregnancy, unspecified: Secondary | ICD-10-CM | POA: Diagnosis present

## 2018-02-02 LAB — BASIC METABOLIC PANEL
ANION GAP: 7 (ref 5–15)
BUN: 11 mg/dL (ref 6–20)
CALCIUM: 9.3 mg/dL (ref 8.9–10.3)
CO2: 25 mmol/L (ref 22–32)
CREATININE: 0.64 mg/dL (ref 0.44–1.00)
Chloride: 104 mmol/L (ref 98–111)
GFR calc Af Amer: >60 mL/min (ref >60)
GFR calc non Af Amer: >60 mL/min (ref >60)
Glucose, Bld: 106 mg/dL — ABNORMAL HIGH (ref 70–99)
Potassium: 3.8 mmol/L (ref 3.5–5.1)
Sodium: 136 mmol/L (ref 135–145)

## 2018-02-02 LAB — CBC
HCT: 37.6 % (ref 35.0–47.0)
Hemoglobin: 13.1 g/dL (ref 12.0–16.0)
MCH: 30.6 pg (ref 26.0–34.0)
MCHC: 34.9 g/dL (ref 32.0–36.0)
MCV: 87.7 fL (ref 80.0–100.0)
PLATELETS: 279 10*3/uL (ref 150–440)
RBC: 4.29 MIL/uL (ref 3.80–5.20)
RDW: 12.7 % (ref 11.5–14.5)
WBC: 14.4 10*3/uL — ABNORMAL HIGH (ref 3.6–11.0)

## 2018-02-02 LAB — HCG, QUANTITATIVE, PREGNANCY: hCG, Beta Chain, Quant, S: 13885 m[IU]/mL — ABNORMAL HIGH (ref <5)

## 2018-02-02 LAB — ABO/RH: ABO/RH(D): O POS

## 2018-02-02 NOTE — ED Triage Notes (Signed)
Patient c/o vaginal bleeding and lower/medial abdominal discomfort described as pulling beginning 1 hour ago. Patient is [redacted] weeks pregnant. Patient reports hx of 2 previous miscarriages and 1 viable pregnancy. Patient reports scant amount of blood.

## 2018-02-03 ENCOUNTER — Emergency Department
Admission: EM | Admit: 2018-02-03 | Discharge: 2018-02-03 | Disposition: A | Discharge status: Home / Self Care | Payer: 59 | Attending: Emergency Medicine | Admitting: Emergency Medicine

## 2018-02-03 ENCOUNTER — Emergency Department: Payer: 59

## 2018-02-03 DIAGNOSIS — O200 Threatened abortion: Secondary | ICD-10-CM

## 2018-02-03 DIAGNOSIS — B9689 Other specified bacterial agents as the cause of diseases classified elsewhere: Secondary | ICD-10-CM

## 2018-02-03 DIAGNOSIS — N760 Acute vaginitis: Secondary | ICD-10-CM

## 2018-02-03 LAB — CHLAMYDIA/NGC RT PCR (ARMC ONLY)
Chlamydia Tr: NOT DETECTED
N GONORRHOEAE: NOT DETECTED

## 2018-02-03 LAB — WET PREP, GENITAL
SPERM: NONE SEEN
TRICH WET PREP: NONE SEEN
YEAST WET PREP: NONE SEEN

## 2018-02-03 LAB — URINALYSIS, COMPLETE (UACMP) WITH MICROSCOPIC
Bilirubin Urine: NEGATIVE
Glucose, UA: NEGATIVE mg/dL
Ketones, ur: NEGATIVE mg/dL
Leukocytes, UA: NEGATIVE
Nitrite: NEGATIVE
PROTEIN: NEGATIVE mg/dL
SPECIFIC GRAVITY, URINE: 1.008 (ref 1.005–1.030)
pH: 7 (ref 5.0–8.0)

## 2018-02-03 MED ORDER — METRONIDAZOLE 500 MG PO TABS: 500.0000 mg | ORAL_TABLET | Freq: Two times a day (BID) | ORAL | 0 refills | Status: AC

## 2018-02-03 NOTE — Discharge Instructions (Addendum)
Please follow up with your OB/GYN for further evaluation of your vaginal bleeding. Please return with any other concern or condition.

## 2018-02-03 NOTE — ED Provider Notes (Signed)
[facility] Emergency Department Provider Note   ____________________________________________   First MD Initiated Contact with Patient 02/03/18 0111     (approximate)  I have reviewed the triage vital signs and the nursing notes.   HISTORY  Chief Complaint Vaginal Bleeding    HPI Jessica Horne is a 32 y.o. female who comes into the hospital today with vaginal bleeding.  The patient is [redacted] weeks pregnant and a G4 P1-0-2-1.  The patient states that it was a little bit of bleeding earlier but given that she is had 2 miscarriages previously she is concerned.  The patient has had some discomfort in her pelvis like a pulling sensation but no significant pain.  She reports that the blood was light brown.  She has not seen anything since but she is here to get some peace of mind.  The patient has not yet seen her OB/GYN but has an appointment scheduled for July 31.  She is here for evaluation.   History reviewed. No pertinent past medical history.  There are no active problems to display for this patient.   Past Surgical History:  Procedure Laterality Date  . CHOLECYSTECTOMY      Prior to Admission medications   Medication Sig Start Date End Date Taking? Authorizing Provider  cephALEXin (KEFLEX) 250 MG capsule Take 1 capsule (250 mg total) by mouth 4 (four) times daily. 12/19/15   Sharyn CreamerQuale, Mark, MD  metroNIDAZOLE (FLAGYL) 500 MG tablet Take 1 tablet (500 mg total) by mouth 2 (two) times daily for 7 days. 02/03/18 02/10/18  Rebecka ApleyWebster, Allison P, MD  ondansetron (ZOFRAN) 4 MG tablet Take 4 mg by mouth every 8 (eight) hours as needed for nausea or vomiting.    [provider]  promethazine (PHENERGAN) 12.5 MG tablet Take 1 tablet (12.5 mg total) by mouth every 6 (six) hours as needed for nausea or vomiting. 07/26/16   Willy Eddyobinson, Patrick, MD    Allergies Sulfa antibiotics  No family history on file.  Social History Social History   Tobacco Use  . Smoking  status: Never Smoker  . Smokeless tobacco: Never Used  Substance Use Topics  . Alcohol use: No    Alcohol/week: 0.0 oz  . Drug use: No    Review of Systems  Constitutional: No fever/chills Eyes: No visual changes. ENT: No sore throat. Cardiovascular: Denies chest pain. Respiratory: Denies shortness of breath. Gastrointestinal: No abdominal pain.  No nausea, no vomiting.  No diarrhea.  No constipation. Genitourinary: Vaginal bleeding Musculoskeletal: Negative for back pain. Skin: Negative for rash. Neurological: Negative for headaches, focal weakness or numbness.   ____________________________________________   PHYSICAL EXAM:  VITAL SIGNS: ED Triage Vitals [02/02/18 2025]  Enc Vitals Group     BP (!) 126/93     Pulse Rate 85     Resp 18     Temp 98.7 F (37.1 C)     Temp Source Oral     SpO2 98 %     Weight 163 lb (73.9 kg)     Height 5' (1.524 m)     Head Circumference      Peak Flow      Pain Score 2     Pain Loc      Pain Edu?      Excl. in GC?     Constitutional: Alert and oriented. Well appearing and in mild distress. Eyes: Conjunctivae are normal. PERRL. EOMI. Head: Atraumatic. Nose: No congestion/rhinnorhea. Mouth/Throat: Mucous membranes are moist.  Oropharynx non-erythematous. Cardiovascular: Normal rate, regular rhythm. Grossly normal heart sounds.  Good peripheral circulation. Respiratory: Normal respiratory effort.  No retractions. Lungs CTAB. Gastrointestinal: Soft and nontender. No distention. Positive bowel sounds Genitourinary: Normal external genitalia with no blood in the vaginal vault, no cervical motion tenderness, no uterine or adnexal tenderness to palpation. Musculoskeletal: No lower extremity tenderness nor edema.   Neurologic:  Normal speech and language.  Skin:  Skin is warm, dry and intact. Psychiatric: Mood and affect are normal.   ____________________________________________   LABS (all labs ordered are listed, but only  abnormal results are displayed)  Labs Reviewed  WET PREP, GENITAL - Abnormal; Notable for the following components:      Result Value   Clue Cells Wet Prep HPF POC PRESENT (*)    WBC, Wet Prep HPF POC MODERATE (*)    All other components within normal limits  CBC - Abnormal; Notable for the following components:   WBC 14.4 (*)    All other components within normal limits  HCG, QUANTITATIVE, PREGNANCY - Abnormal; Notable for the following components:   hCG, Beta Chain, Quant, S 13,885 (*)    All other components within normal limits  BASIC METABOLIC PANEL - Abnormal; Notable for the following components:   Glucose, Bld 106 (*)    All other components within normal limits  URINALYSIS, COMPLETE (UACMP) WITH MICROSCOPIC - Abnormal; Notable for the following components:   Color, Urine STRAW (*)    APPearance CLEAR (*)    Hgb urine dipstick SMALL (*)    Bacteria, UA RARE (*)    All other components within normal limits  CHLAMYDIA/NGC RT PCR ([facility] ONLY)  ABO/RH   ____________________________________________  EKG  none ____________________________________________  RADIOLOGY  ED MD interpretation:  US Ob Comp, Less than 14 weeks: Single live intrauterine pregnancy with an estimated gestational age of [redacted] weeks and 5 days, probable arcuate or septate morphology of the uterus with gestational sac in the right endometrial canal, unremarkable ovaries.  Official radiology report(s): US Ob Comp Less 14 Wks  Result Date: 02/03/2018 CLINICAL DATA:  32 year old pregnant female with vaginal bleeding. LMP: 12/15/2017 corresponding to an estimated gestational age of [redacted] weeks, 1 day. EXAM: OBSTETRIC <14 WK [place] AND TRANSVAGINAL OB US TECHNIQUE: Both transabdominal and transvaginal ultrasound examinations were performed for complete evaluation of the gestation as well as the maternal uterus, adnexal regions, and pelvic cul-de-sac. Transvaginal technique was performed to assess early pregnancy.  COMPARISON:  None. FINDINGS: Intrauterine gestational sac: Single gestational sac. Yolk sac:  Seen Embryo:  Present Cardiac Activity: Detected Heart Rate: 128 bpm CRL:  8 mm   6 w   5 d                  [place] EDC: 09/24/2018 Subchorionic hemorrhage: None Maternal uterus/adnexae: The ovaries appear unremarkable. The left ovary measures 3.0 x 1.7 x 2.1 cm and the right ovary measures 3.4 x 1.8 x 3.0 cm. There is a corpus luteum in the right ovary. The uterus appears to have an arcuate or partially septate morphology. The gestational sac is in the right endometrial canal. IMPRESSION: 1. Single live intrauterine pregnancy with an estimated gestational age of [redacted] weeks, 5 days. 2. Probable arcuate or septate morphology of the uterus with gestational sac in the right endometrial canal. 3. Unremarkable ovaries. Electronically Signed   By: Elgie Collard M.D.   On: 02/03/2018 04:42   US Ob Transvaginal  Result Date: 02/03/2018 CLINICAL DATA:  32 year old pregnant female with vaginal bleeding. LMP: 12/15/2017 corresponding to an estimated gestational age of [redacted] weeks, 1 day. EXAM: OBSTETRIC <14 WK [place] AND TRANSVAGINAL OB US TECHNIQUE: Both transabdominal and transvaginal ultrasound examinations were performed for complete evaluation of the gestation as well as the maternal uterus, adnexal regions, and pelvic cul-de-sac. Transvaginal technique was performed to assess early pregnancy. COMPARISON:  None. FINDINGS: Intrauterine gestational sac: Single gestational sac. Yolk sac:  Seen Embryo:  Present Cardiac Activity: Detected Heart Rate: 128 bpm CRL:  8 mm   6 w   5 d                  [place] EDC: 09/24/2018 Subchorionic hemorrhage: None Maternal uterus/adnexae: The ovaries appear unremarkable. The left ovary measures 3.0 x 1.7 x 2.1 cm and the right ovary measures 3.4 x 1.8 x 3.0 cm. There is a corpus luteum in the right ovary. The uterus appears to have an arcuate or partially septate morphology. The gestational sac is in the right  endometrial canal. IMPRESSION: 1. Single live intrauterine pregnancy with an estimated gestational age of [redacted] weeks, 5 days. 2. Probable arcuate or septate morphology of the uterus with gestational sac in the right endometrial canal. 3. Unremarkable ovaries. Electronically Signed   By: Elgie Collard M.D.   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  She is [redacted] weeks pregnant.  We did check some blood work on the patient to include a CBC, hCG, ABO Rh.  The patient had a wet prep which showed some clue cells.  Her chlamydia and gonorrhea are both not detected.  Her urinalysis is negative.  The patient does have a white blood cell count of 14.4 and her urinalysis is unremarkable.  The patient's BMP is negative and her ultrasound shows a single live intrauterine pregnancy.  I will give the patient a prescription for some antibiotics for her bacterial vaginosis and I will discharge her to follow-up with OB/GYN.  The patient has no further questions or concerns at this time.  She will be discharged home.      ____________________________________________   FINAL CLINICAL IMPRESSION(S) / ED DIAGNOSES  Final diagnoses:  Threatened miscarriage  Bacterial vaginosis     ED Discharge Orders        Ordered    metroNIDAZOLE (FLAGYL) 500 MG tablet  2 times daily     02/03/18 0506       Note:  This document was prepared using Dragon voice recognition software and may include  unintentional dictation errors.    Rebecka Apley, MD 02/03/18 [phone]

## 2018-02-03 NOTE — ED Notes (Signed)
Urged patient to void, and placed a urinal hat.

## 2019-09-04 IMAGING — US US OB TRANSVAGINAL
1 series · 13 of 28 positions shown · non-contrast
Comparison: None.

CLINICAL DATA: 32-year-old pregnant female with vaginal bleeding.
LMP: 12/15/2017 corresponding to an estimated gestational age of 7
weeks, 1 day.

EXAM:
OBSTETRIC <14 WK US AND TRANSVAGINAL OB US
TECHNIQUE: Both transabdominal and transvaginal ultrasound examinations were
performed for complete evaluation of the gestation as well as the
maternal uterus, adnexal regions, and pelvic cul-de-sac.
Transvaginal technique was performed to assess early pregnancy.

[Series 1: us ob transvaginal · 13 of 104 slices shown]
[im 4/104]
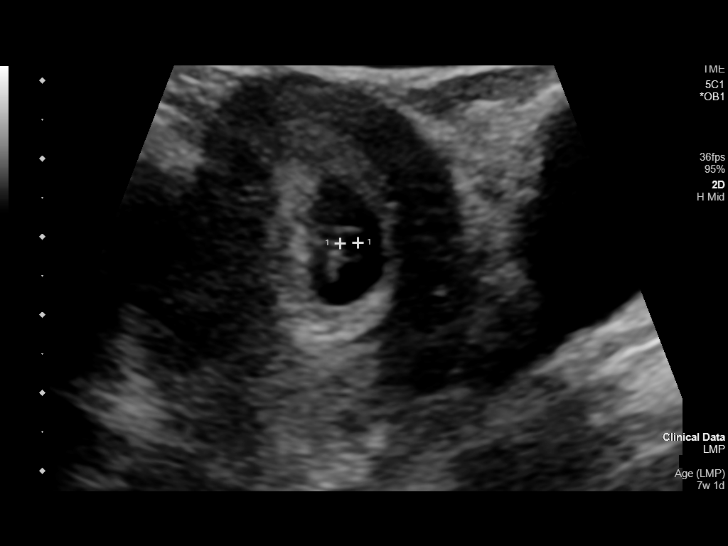
[im 12/104]
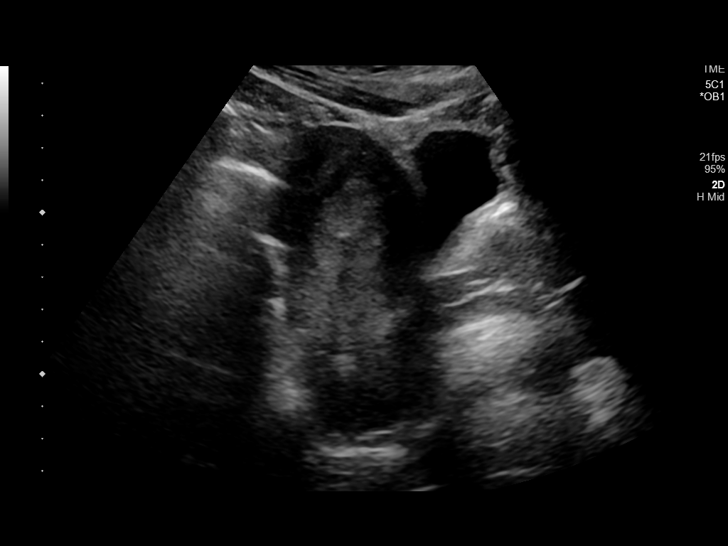
[im 20/104]
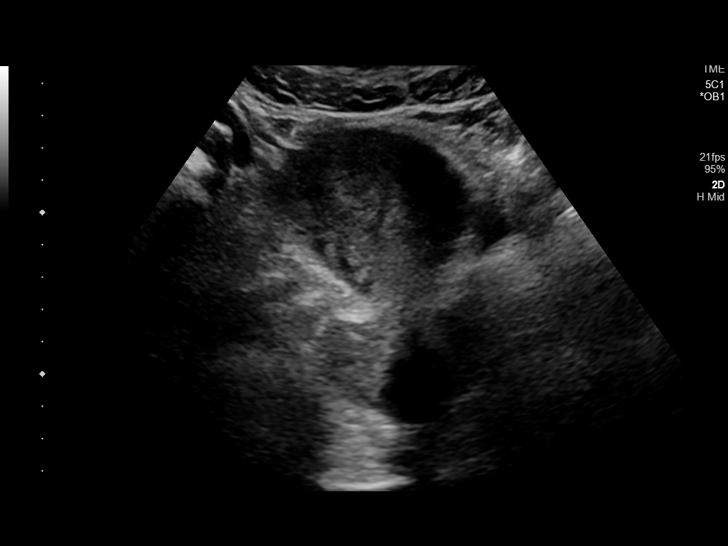
[im 27/104]
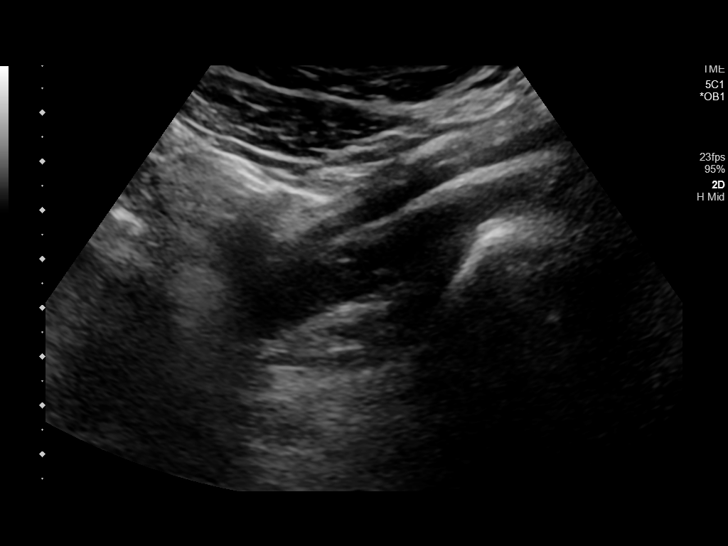
[im 35/104]
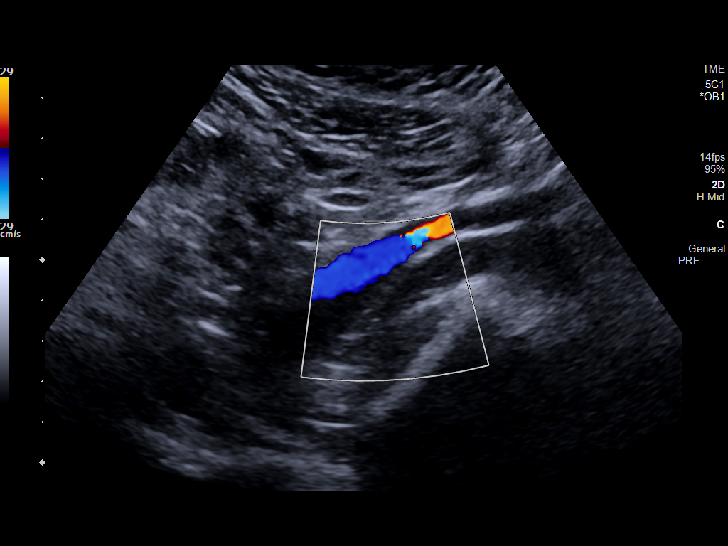
[im 42/104]
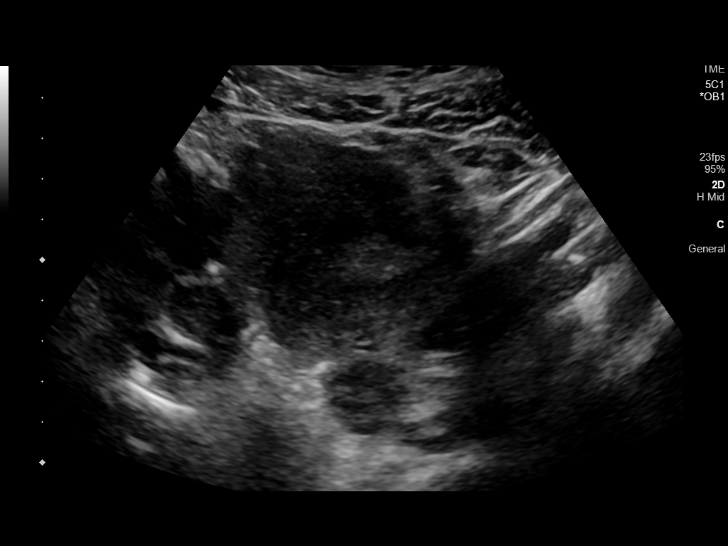
[im 54/104]
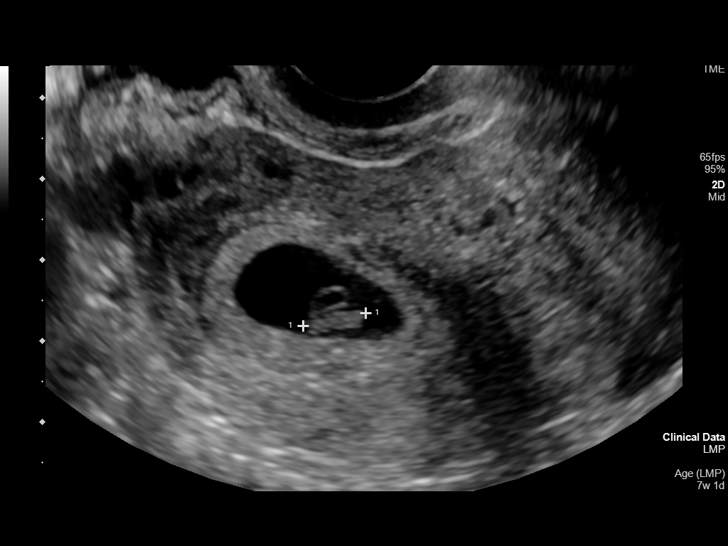
[im 62/104]
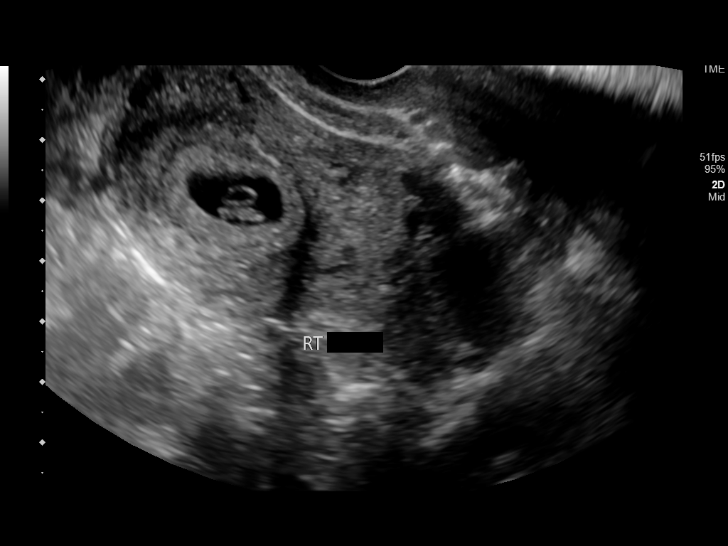
[im 69/104]
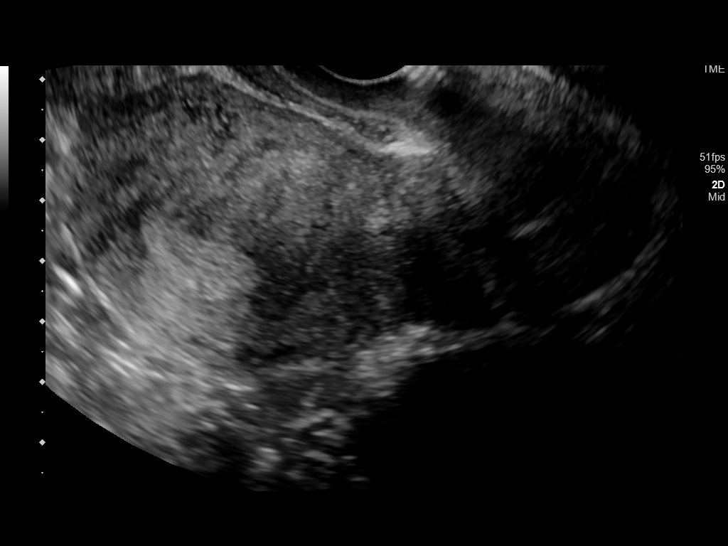
[im 77/104]
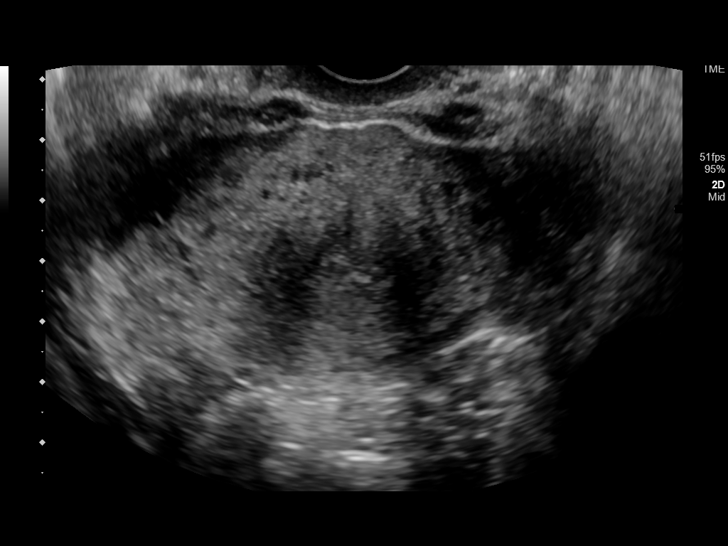
[im 84/104]
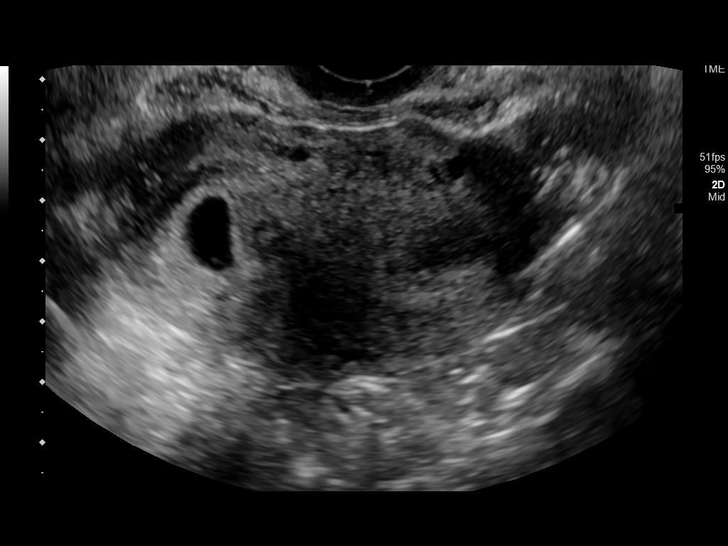
[im 92/104]
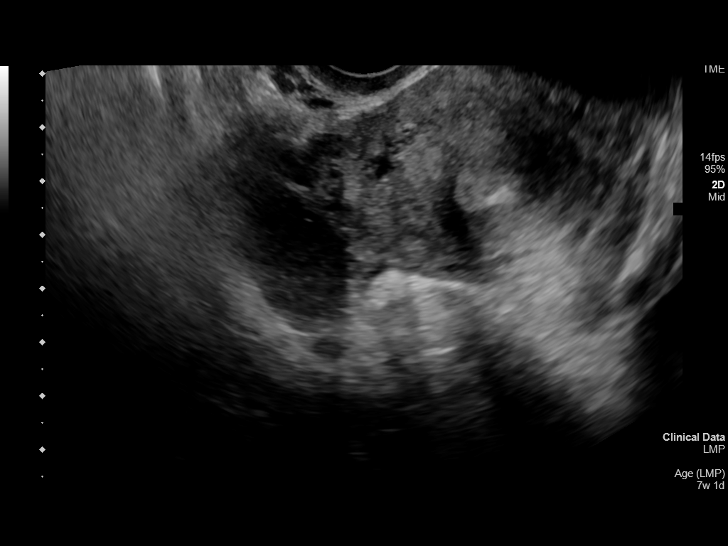
[im 100/104]
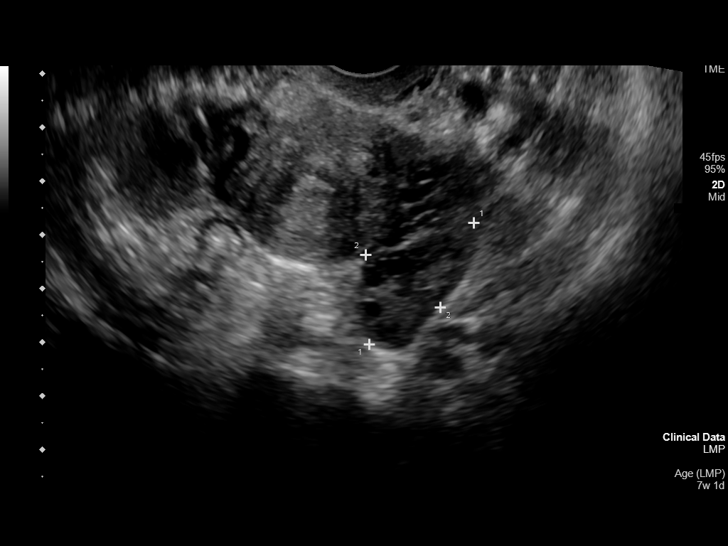

[13 of 28 positions shown; findings below may reference images not displayed]

FINDINGS: Intrauterine gestational sac: Single gestational sac.

Yolk sac:  Seen

Embryo:  Present

Cardiac Activity: Detected

Heart Rate: 128 bpm

CRL:  8 mm   6 w   5 d                  [place] EDC: 09/24/2018

Subchorionic hemorrhage: None

Maternal uterus/adnexae: The ovaries appear unremarkable. The left
ovary measures 3.0 x 1.7 x 2.1 cm and the right ovary measures 3.4 x
1.8 x 3.0 cm. There is a corpus luteum in the right ovary.

The uterus appears to have an arcuate or partially septate
morphology. The gestational sac is in the right endometrial canal.
IMPRESSION: 1. Single live intrauterine pregnancy with an estimated gestational
age of 6 weeks, 5 days.
2. Probable arcuate or septate morphology of the uterus with
gestational sac in the right endometrial canal.
3. Unremarkable ovaries.
